# Patient Record
Sex: Male | Born: 1950 | Race: Black or African American | Hispanic: No | Marital: Single | Smoking: Never smoker
Health system: Southern US, Community
[De-identification: ages and names within clinical notes are randomized; demographics above are authoritative.]

## PROBLEM LIST (undated history)

## (undated) DIAGNOSIS — G8929 Other chronic pain: Secondary | ICD-10-CM

## (undated) DIAGNOSIS — M549 Dorsalgia, unspecified: Secondary | ICD-10-CM

## (undated) HISTORY — PX: HIP SURGERY: SHX245

## (undated) HISTORY — DX: Other chronic pain: G89.29

## (undated) HISTORY — PX: THORACOTOMY: SUR1349

## (undated) HISTORY — DX: Dorsalgia, unspecified: M54.9

---

## 2000-02-11 ENCOUNTER — Emergency Department (HOSPITAL_COMMUNITY): Admission: EM | Admit: 2000-02-11 | Discharge: 2000-02-11 | Payer: Self-pay | Admitting: Emergency Medicine

## 2000-02-11 ENCOUNTER — Encounter: Payer: Self-pay | Admitting: Emergency Medicine

## 2009-09-02 ENCOUNTER — Emergency Department (HOSPITAL_COMMUNITY): Admission: EM | Admit: 2009-09-02 | Discharge: 2009-09-02 | Payer: Self-pay | Admitting: Emergency Medicine

## 2009-12-10 ENCOUNTER — Emergency Department (HOSPITAL_COMMUNITY): Admission: EM | Admit: 2009-12-10 | Discharge: 2009-12-10 | Payer: Self-pay | Admitting: Emergency Medicine

## 2011-02-03 LAB — BASIC METABOLIC PANEL
BUN: 10 mg/dL (ref 6–23)
CO2: 28 mEq/L (ref 19–32)
Calcium: 8.9 mg/dL (ref 8.4–10.5)
Chloride: 103 mEq/L (ref 96–112)
Creatinine, Ser: 1.15 mg/dL (ref 0.4–1.5)
GFR calc Af Amer: >60 mL/min (ref >60)
GFR calc non Af Amer: >60 mL/min (ref >60)
Glucose, Bld: 156 mg/dL — ABNORMAL HIGH (ref 70–99)
Potassium: 4 mEq/L (ref 3.5–5.1)
Sodium: 140 mEq/L (ref 135–145)

## 2011-02-03 LAB — URINALYSIS, ROUTINE W REFLEX MICROSCOPIC
Bilirubin Urine: NEGATIVE
Glucose, UA: NEGATIVE mg/dL
Nitrite: NEGATIVE
Specific Gravity, Urine: 1.024 (ref 1.005–1.030)
pH: 8 (ref 5.0–8.0)

## 2011-02-03 LAB — CBC
HCT: 40.6 % (ref 39.0–52.0)
Hemoglobin: 14 g/dL (ref 13.0–17.0)
MCHC: 34.5 g/dL (ref 30.0–36.0)
MCV: 95.9 fL (ref 78.0–100.0)
Platelets: 183 10*3/uL (ref 150–400)
RBC: 4.24 MIL/uL (ref 4.22–5.81)
RDW: 13.3 % (ref 11.5–15.5)
WBC: 11.3 10*3/uL — ABNORMAL HIGH (ref 4.0–10.5)

## 2011-02-03 LAB — HEPATIC FUNCTION PANEL
ALT: 20 U/L (ref 0–53)
Albumin: 3.8 g/dL (ref 3.5–5.2)
Indirect Bilirubin: 0.5 mg/dL (ref 0.3–0.9)
Total Protein: 7.1 g/dL (ref 6.0–8.3)

## 2011-02-03 LAB — LIPASE, BLOOD: Lipase: 41 U/L (ref 11–59)

## 2011-02-03 LAB — DIFFERENTIAL
Eosinophils Absolute: 0 10*3/uL (ref 0.0–0.7)
Lymphs Abs: 1.2 10*3/uL (ref 0.7–4.0)
Monocytes Relative: 2 % — ABNORMAL LOW (ref 3–12)
Neutro Abs: 9.9 10*3/uL — ABNORMAL HIGH (ref 1.7–7.7)
Neutrophils Relative %: 87 % — ABNORMAL HIGH (ref 43–77)

## 2018-12-31 DIAGNOSIS — G8929 Other chronic pain: Secondary | ICD-10-CM

## 2018-12-31 HISTORY — DX: Other chronic pain: G89.29

## 2019-01-09 ENCOUNTER — Encounter: Payer: Self-pay | Admitting: Medical

## 2019-01-09 ENCOUNTER — Ambulatory Visit (INDEPENDENT_AMBULATORY_CARE_PROVIDER_SITE_OTHER): Payer: 59 | Admitting: Medical

## 2019-01-09 VITALS — BP 140/90 | HR 56 | Temp 98.4°F | Resp 16 | Ht 68.5 in | Wt 191.2 lb

## 2019-01-09 DIAGNOSIS — Z1211 Encounter for screening for malignant neoplasm of colon: Secondary | ICD-10-CM

## 2019-01-09 DIAGNOSIS — Z125 Encounter for screening for malignant neoplasm of prostate: Secondary | ICD-10-CM

## 2019-01-09 DIAGNOSIS — Z0000 Encounter for general adult medical examination without abnormal findings: Secondary | ICD-10-CM

## 2019-01-09 DIAGNOSIS — Z131 Encounter for screening for diabetes mellitus: Secondary | ICD-10-CM | POA: Diagnosis not present

## 2019-01-09 DIAGNOSIS — Z7185 Encounter for immunization safety counseling: Secondary | ICD-10-CM | POA: Insufficient documentation

## 2019-01-09 DIAGNOSIS — K4090 Unilateral inguinal hernia, without obstruction or gangrene, not specified as recurrent: Secondary | ICD-10-CM | POA: Diagnosis not present

## 2019-01-09 DIAGNOSIS — R030 Elevated blood-pressure reading, without diagnosis of hypertension: Secondary | ICD-10-CM

## 2019-01-09 DIAGNOSIS — Z1159 Encounter for screening for other viral diseases: Secondary | ICD-10-CM

## 2019-01-09 DIAGNOSIS — Z7189 Other specified counseling: Secondary | ICD-10-CM

## 2019-01-09 DIAGNOSIS — Z113 Encounter for screening for infections with a predominantly sexual mode of transmission: Secondary | ICD-10-CM

## 2019-01-09 LAB — POCT URINALYSIS DIP (PROADVANTAGE DEVICE)
BILIRUBIN UA: NEGATIVE mg/dL
Bilirubin, UA: NEGATIVE
Blood, UA: NEGATIVE
Glucose, UA: NEGATIVE mg/dL
LEUKOCYTES UA: NEGATIVE
NITRITE UA: NEGATIVE
PH UA: 6 (ref 5.0–8.0)
PROTEIN UA: NEGATIVE mg/dL
Specific Gravity, Urine: 1.015
UUROB: NEGATIVE

## 2019-01-09 NOTE — Patient Instructions (Signed)
Thanks for trusting [place] with your health care and for coming in for a physical today.  Below are some general recommendations I have for you:  Yearly screenings See your eye doctor yearly for routine vision care. See your dentist yearly for routine dental care including hygiene visits twice yearly. See me here yearly for a routine physical and preventative care visit   Specific Concerns today:  . Since she has not seen a doctor in many years you are due for several recommended screening test and preventative care measures . I recommend an updated tetanus vaccine, yearly flu shot, pneumococcal vaccine and shingles vaccine . I recommend a colon cancer screen . Your blood pressure was elevated today.  This will need to be monitored over the next month or 2 to determine if you have high blood pressure.  High blood pressure can lead to heart disease, stroke, heart attack, kidney damage, eye damage . We will call with lab results and next steps   Please follow up yearly for a physical.   Preventative Care for Adults - Male      MAINTAIN REGULAR HEALTH EXAMS:  A routine yearly physical is a good way to check in with your primary care provider about your health and preventive screening. It is also an opportunity to share updates about your health and any concerns you have, and receive a thorough all-over exam.   Most health insurance companies pay for at least some preventative services.  Check with your health plan for specific coverages.  WHAT PREVENTATIVE SERVICES DO MEN NEED?  Adult men should have their weight and blood pressure checked regularly.   Men age 79 and older should have their cholesterol levels checked regularly.  Beginning at age 39 and continuing to age 66, men should be screened for colorectal cancer.  Certain people may need continued testing until age 67.  Updating vaccinations is part of preventative care.  Vaccinations help protect against diseases such as the  flu.  Osteoporosis is a disease in which the bones lose minerals and strength as we age. Men ages 83 and over should discuss this with their caregivers  Lab tests are generally done as part of preventative care to screen for anemia and blood disorders, to screen for problems with the kidneys and liver, to screen for bladder problems, to check blood sugar, and to check your cholesterol level.  Preventative services generally include counseling about diet, exercise, avoiding tobacco, drugs, excessive alcohol consumption, and sexually transmitted infections.    GENERAL RECOMMENDATIONS FOR GOOD HEALTH:  Healthy diet:  Eat a variety of foods, including fruit, vegetables, animal or vegetable protein, such as meat, fish, chicken, and eggs, or beans, lentils, tofu, and grains, such as rice.  Drink plenty of water daily.  Decrease saturated fat in the diet, avoid lots of red meat, processed foods, sweets, fast foods, and fried foods.  Exercise:  Aerobic exercise helps maintain good heart health. At least 30-40 minutes of moderate-intensity exercise is recommended. For example, a brisk walk that increases your heart rate and breathing. This should be done on most days of the week.   Find a type of exercise or a variety of exercises that you enjoy so that it becomes a part of your daily life.  Examples are running, walking, swimming, water aerobics, and biking.  For motivation and support, explore group exercise such as aerobic class, spin class, Zumba, Yoga,or  martial arts, etc.    Set exercise goals for yourself, such as  a certain weight goal, walk or run in a race such as a 5k walk/run.  Speak to your primary care provider about exercise goals.  Disease prevention:  If you smoke or chew tobacco, find out from your caregiver how to quit. It can literally save your life, no matter how long you have been a tobacco user. If you do not use tobacco, never begin.   Maintain a healthy diet and normal  weight. Increased weight leads to problems with blood pressure and diabetes.   The Body Mass Index or BMI is a way of measuring how much of your body is fat. Having a BMI above 27 increases the risk of heart disease, diabetes, hypertension, stroke and other problems related to obesity. Your caregiver can help determine your BMI and based on it develop an exercise and dietary program to help you achieve or maintain this important measurement at a healthful level.  High blood pressure causes heart and blood vessel problems.  Persistent high blood pressure should be treated with medicine if weight loss and exercise do not work.   Fat and cholesterol leaves deposits in your arteries that can block them. This causes heart disease and vessel disease elsewhere in your body.  If your cholesterol is found to be high, or if you have heart disease or certain other medical conditions, then you may need to have your cholesterol monitored frequently and be treated with medication.   Ask if you should have a cardiac stress test if your history suggests this. A stress test is a test done on a treadmill that looks for heart disease. This test can find disease prior to there being a problem.  Osteoporosis is a disease in which the bones lose minerals and strength as we age. This can result in serious bone fractures. Risk of osteoporosis can be identified using a bone density scan. Men ages 72 and over should discuss this with their caregivers. Ask your caregiver whether you should be taking a calcium supplement and Vitamin D, to reduce the rate of osteoporosis.   Avoid drinking alcohol in excess (more than two drinks per day).  Avoid use of street drugs. Do not share needles with anyone. Ask for professional help if you need assistance or instructions on stopping the use of alcohol, cigarettes, and/or drugs.  Brush your teeth twice a day with fluoride toothpaste, and floss once a day. Good oral hygiene prevents tooth  decay and gum disease. The problems can be painful, unattractive, and can cause other health problems. Visit your dentist for a routine oral and dental check up and preventive care every 6-12 months.   Look at your skin regularly.  Use a mirror to look at your back. Notify your caregivers of changes in moles, especially if there are changes in shapes, colors, a size larger than a pencil eraser, an irregular border, or development of new moles.  Safety:  Use seatbelts 100% of the time, whether driving or as a passenger.  Use safety devices such as hearing protection if you work in environments with loud noise or significant background noise.  Use safety glasses when doing any work that could send debris in to the eyes.  Use a helmet if you ride a bike or motorcycle.  Use appropriate safety gear for contact sports.  Talk to your caregiver about gun safety.  Use sunscreen with a SPF (or skin protection factor) of 15 or greater.  Lighter skinned people are at a greater risk of skin cancer.  Don't forget to also wear sunglasses in order to protect your eyes from too much damaging sunlight. Damaging sunlight can accelerate cataract formation.   Practice safe sex. Use condoms. Condoms are used for birth control and to help reduce the spread of sexually transmitted infections (or STIs).  Some of the STIs are gonorrhea (the clap), chlamydia, syphilis, trichomonas, herpes, HPV (human papilloma virus) and HIV (human immunodeficiency virus) which causes AIDS. The herpes, HIV and HPV are viral illnesses that have no cure. These can result in disability, cancer and death.   Keep carbon monoxide and smoke detectors in your home functioning at all times. Change the batteries every 6 months or use a model that plugs into the wall.   Vaccinations:  Stay up to date with your tetanus shots and other required immunizations. You should have a booster for tetanus every 10 years. Be sure to get your flu shot every year,  since 5%-20% of the [place] population comes down with the flu. The flu vaccine changes each year, so being vaccinated once is not enough. Get your shot in the fall, before the flu season peaks.   Other vaccines to consider:  Human Papilloma Virus or HPV causes cancer of the cervix, and other infections that can be transmitted from person to person. There is a vaccine for HPV, and males should get immunized between the ages of 92 and 80. It requires a series of 3 shots.   Pneumococcal vaccine to protect against certain types of pneumonia.  This is normally recommended for adults age 30 or older.  However, adults younger than 68 years old with certain underlying conditions such as diabetes, heart or lung disease should also receive the vaccine.  Shingles vaccine to protect against Varicella Zoster if you are older than age 35, or younger than 68 years old with certain underlying illness.  If you have not had the Shingrix vaccine, please call your insurer to inquire about coverage for the Shingrix vaccine given in 2 doses.   Some insurers cover this vaccine after age 30, some cover this after age 31.  If your insurer covers this, then call to schedule appointment to have this vaccine here  Hepatitis A vaccine to protect against a form of infection of the liver by a virus acquired from food.  Hepatitis B vaccine to protect against a form of infection of the liver by a virus acquired from blood or body fluids, particularly if you work in health care.  If you plan to travel internationally, check with your local health department for specific vaccination recommendations.   What should I know about cancer screening? Many types of cancers can be detected early and may often be prevented. Lung Cancer  You should be screened every year for lung cancer if: ? You are a current smoker who has smoked for at least 30 years. ? You are a former smoker who has quit within the past 15 years.  Talk to your health  care provider about your screening options, when you should start screening, and how often you should be screened.  Colorectal Cancer  Routine colorectal cancer screening usually begins at 68 years of age and should be repeated every 5-10 years until you are 68 years old. You may need to be screened more often if early forms of precancerous polyps or small growths are found. Your health care provider may recommend screening at an earlier age if you have risk factors for colon cancer.  Your health care provider  may recommend using home test kits to check for hidden blood in the stool.  A small camera at the end of a tube can be used to examine your colon (sigmoidoscopy or colonoscopy). This checks for the earliest forms of colorectal cancer.  Prostate and Testicular Cancer  Depending on your age and overall health, your health care provider may do certain tests to screen for prostate and testicular cancer.  Talk to your health care provider about any symptoms or concerns you have about testicular or prostate cancer.  Skin Cancer  Check your skin from head to toe regularly.  Tell your health care provider about any new moles or changes in moles, especially if: ? There is a change in a mole's size, shape, or color. ? You have a mole that is larger than a pencil eraser.  Always use sunscreen. Apply sunscreen liberally and repeat throughout the day.  Protect yourself by wearing long sleeves, pants, a wide-brimmed hat, and sunglasses when outside.
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
## 2019-01-10 ENCOUNTER — Other Ambulatory Visit: Payer: Self-pay

## 2019-01-10 DIAGNOSIS — Z1211 Encounter for screening for malignant neoplasm of colon: Secondary | ICD-10-CM

## 2019-01-10 DIAGNOSIS — K4090 Unilateral inguinal hernia, without obstruction or gangrene, not specified as recurrent: Secondary | ICD-10-CM

## 2019-01-10 LAB — LIPID PANEL
CHOL/HDL RATIO: 2.2 ratio (ref 0.0–5.0)
Cholesterol, Total: 156 mg/dL (ref 100–199)
HDL: 70 mg/dL (ref >39)
LDL CALC: 73 mg/dL (ref 0–99)
TRIGLYCERIDES: 66 mg/dL (ref 0–149)
VLDL Cholesterol Cal: 13 mg/dL (ref 5–40)

## 2019-01-10 LAB — CBC WITH DIFFERENTIAL/PLATELET
BASOS: 1 %
Basophils Absolute: 0.1 10*3/uL (ref 0.0–0.2)
EOS (ABSOLUTE): 0.2 10*3/uL (ref 0.0–0.4)
EOS: 3 %
HEMATOCRIT: 40.7 % (ref 37.5–51.0)
HEMOGLOBIN: 13.4 g/dL (ref 13.0–17.7)
Immature Grans (Abs): 0 10*3/uL (ref 0.0–0.1)
Immature Granulocytes: 0 %
LYMPHS ABS: 2.1 10*3/uL (ref 0.7–3.1)
Lymphs: 37 %
MCH: 32.8 pg (ref 26.6–33.0)
MCHC: 32.9 g/dL (ref 31.5–35.7)
MCV: 100 fL — AB (ref 79–97)
MONOCYTES: 8 %
Monocytes Absolute: 0.5 10*3/uL (ref 0.1–0.9)
NEUTROS ABS: 2.8 10*3/uL (ref 1.4–7.0)
Neutrophils: 51 %
Platelets: 226 10*3/uL (ref 150–450)
RBC: 4.09 x10E6/uL — AB (ref 4.14–5.80)
RDW: 11.7 % (ref 11.6–15.4)
WBC: 5.5 10*3/uL (ref 3.4–10.8)

## 2019-01-10 LAB — COMPREHENSIVE METABOLIC PANEL
ALBUMIN: 4.1 g/dL (ref 3.8–4.8)
ALT: 14 IU/L (ref 0–44)
AST: 19 IU/L (ref 0–40)
Albumin/Globulin Ratio: 1.6 (ref 1.2–2.2)
Alkaline Phosphatase: 53 IU/L (ref 39–117)
BUN / CREAT RATIO: 12 (ref 10–24)
BUN: 12 mg/dL (ref 8–27)
Bilirubin Total: 0.6 mg/dL (ref 0.0–1.2)
CO2: 24 mmol/L (ref 20–29)
CREATININE: 0.98 mg/dL (ref 0.76–1.27)
Calcium: 8.9 mg/dL (ref 8.6–10.2)
Chloride: 106 mmol/L (ref 96–106)
GFR calc Af Amer: 92 mL/min/{1.73_m2} (ref >59)
GFR, EST NON AFRICAN AMERICAN: 79 mL/min/{1.73_m2} (ref >59)
GLUCOSE: 90 mg/dL (ref 65–99)
Globulin, Total: 2.6 g/dL (ref 1.5–4.5)
Potassium: 4.7 mmol/L (ref 3.5–5.2)
Sodium: 144 mmol/L (ref 134–144)
TOTAL PROTEIN: 6.7 g/dL (ref 6.0–8.5)

## 2019-01-10 LAB — GC/CHLAMYDIA PROBE AMP
Chlamydia trachomatis, NAA: NEGATIVE
Neisseria gonorrhoeae by PCR: NEGATIVE

## 2019-01-10 LAB — HEPATITIS C ANTIBODY

## 2019-01-10 LAB — RPR: RPR: NONREACTIVE

## 2019-01-10 LAB — HEMOGLOBIN A1C
ESTIMATED AVERAGE GLUCOSE: 108 mg/dL
Hgb A1c MFr Bld: 5.4 % (ref 4.8–5.6)

## 2019-01-10 LAB — PSA: Prostate Specific Ag, Serum: 1.3 ng/mL (ref 0.0–4.0)

## 2019-01-10 LAB — HIV ANTIBODY (ROUTINE TESTING W REFLEX): HIV Screen 4th Generation wRfx: NONREACTIVE

## 2019-01-25 ENCOUNTER — Other Ambulatory Visit: Payer: Self-pay

## 2019-01-25 DIAGNOSIS — Z1211 Encounter for screening for malignant neoplasm of colon: Secondary | ICD-10-CM

## 2019-02-22 LAB — HM COLONOSCOPY

## 2019-02-27 ENCOUNTER — Encounter: Payer: Self-pay | Admitting: Medical

## 2019-03-14 DIAGNOSIS — Z1211 Encounter for screening for malignant neoplasm of colon: Secondary | ICD-10-CM | POA: Diagnosis not present

## 2019-03-21 LAB — COLOGUARD: Cologuard: POSITIVE — AB

## 2019-03-22 ENCOUNTER — Telehealth: Payer: Self-pay | Admitting: Medical

## 2019-03-22 NOTE — Telephone Encounter (Signed)
Unfortunately Cologuard test result was positive.  This can indicate the presence of colorectal cancer or bleeding.   Thus, I recommend referral to gastroenterology at this time.   Please refer to gastroenterology for further evaluations.   (see if they have seen GI prior or have a preference for referral).    

## 2019-03-23 ENCOUNTER — Other Ambulatory Visit: Payer: Self-pay

## 2019-03-23 DIAGNOSIS — R195 Other fecal abnormalities: Secondary | ICD-10-CM

## 2019-03-23 NOTE — Progress Notes (Signed)
gas

## 2019-03-23 NOTE — Telephone Encounter (Signed)
Referral put into epic and proficient. 

## 2019-03-23 NOTE — Telephone Encounter (Signed)
Left message on voicemail for patient to call back. 

## 2019-03-23 NOTE — Telephone Encounter (Signed)
Patient notified of results and is agreeable to referral to GI.

## 2019-04-03 ENCOUNTER — Telehealth: Payer: Self-pay

## 2019-04-03 NOTE — Telephone Encounter (Signed)
Patient was scheduled to see [facility] on 03-22-19 for hernia and no showed the appointment.   Please advise on what you want me to do?

## 2019-04-04 NOTE — Telephone Encounter (Signed)
Call and find out why he didn't go?  And remind him how hard it is to get a patient back in when they no show.

## 2019-04-04 NOTE — Telephone Encounter (Signed)
Patient states that he did go to the appointment and they said that he was not on the schedule.   I told him to call and reschedule appointment asap.  Patient states that he understands and will call them today.

## 2019-04-11 ENCOUNTER — Telehealth: Payer: Self-pay | Admitting: Medical

## 2019-04-11 NOTE — Telephone Encounter (Signed)
FYI Pt called stating that we told him to make appt here. Pt confused about what he needs, after reviewing chart notes he is acutally supposed to call and reschedule missed appt [facility] pt this info and their phone# and he will call them to schedule

## 2020-04-15 ENCOUNTER — Other Ambulatory Visit: Payer: Self-pay

## 2020-04-15 ENCOUNTER — Encounter (HOSPITAL_COMMUNITY): Payer: Self-pay | Admitting: *Deleted

## 2020-04-15 ENCOUNTER — Ambulatory Visit (INDEPENDENT_AMBULATORY_CARE_PROVIDER_SITE_OTHER): Payer: No Typology Code available for payment source | Admitting: Medical

## 2020-04-15 ENCOUNTER — Encounter: Payer: Self-pay | Admitting: Medical

## 2020-04-15 ENCOUNTER — Emergency Department (HOSPITAL_COMMUNITY)
Admission: EM | Admit: 2020-04-15 | Discharge: 2020-04-15 | Disposition: A | Discharge status: Home / Self Care | Payer: No Typology Code available for payment source | Attending: Emergency Medicine | Admitting: Emergency Medicine

## 2020-04-15 VITALS — BP 182/90 | HR 76 | Ht 68.0 in | Wt 185.2 lb

## 2020-04-15 DIAGNOSIS — I10 Essential (primary) hypertension: Secondary | ICD-10-CM | POA: Diagnosis not present

## 2020-04-15 DIAGNOSIS — Y929 Unspecified place or not applicable: Secondary | ICD-10-CM | POA: Insufficient documentation

## 2020-04-15 DIAGNOSIS — W19XXXA Unspecified fall, initial encounter: Secondary | ICD-10-CM

## 2020-04-15 DIAGNOSIS — S0592XA Unspecified injury of left eye and orbit, initial encounter: Secondary | ICD-10-CM | POA: Diagnosis present

## 2020-04-15 DIAGNOSIS — F1010 Alcohol abuse, uncomplicated: Secondary | ICD-10-CM

## 2020-04-15 DIAGNOSIS — Y939 Activity, unspecified: Secondary | ICD-10-CM | POA: Insufficient documentation

## 2020-04-15 DIAGNOSIS — W1839XA Other fall on same level, initial encounter: Secondary | ICD-10-CM | POA: Diagnosis not present

## 2020-04-15 DIAGNOSIS — S0180XA Unspecified open wound of other part of head, initial encounter: Secondary | ICD-10-CM

## 2020-04-15 DIAGNOSIS — Y999 Unspecified external cause status: Secondary | ICD-10-CM | POA: Insufficient documentation

## 2020-04-15 DIAGNOSIS — S0181XA Laceration without foreign body of other part of head, initial encounter: Secondary | ICD-10-CM

## 2020-04-15 DIAGNOSIS — S0031XA Abrasion of nose, initial encounter: Secondary | ICD-10-CM

## 2020-04-15 DIAGNOSIS — F17200 Nicotine dependence, unspecified, uncomplicated: Secondary | ICD-10-CM | POA: Diagnosis not present

## 2020-04-15 DIAGNOSIS — Z23 Encounter for immunization: Secondary | ICD-10-CM | POA: Insufficient documentation

## 2020-04-15 DIAGNOSIS — S01112A Laceration without foreign body of left eyelid and periocular area, initial encounter: Secondary | ICD-10-CM

## 2020-04-15 MED ORDER — ERYTHROMYCIN 5 MG/GM OP OINT
TOPICAL_OINTMENT | Freq: Once | OPHTHALMIC | Status: AC
  Administered 2020-04-15: 1 via OPHTHALMIC
  Filled 2020-04-15: qty 3.5

## 2020-04-15 MED ORDER — TETANUS-DIPHTHERIA TOXOIDS TD 5-2 LFU IM INJ
0.5000 mL | INJECTION | Freq: Once | INTRAMUSCULAR | Status: AC
  Administered 2020-04-15: 0.5 mL via INTRAMUSCULAR
  Filled 2020-04-15: qty 0.5

## 2020-04-15 NOTE — ED Notes (Signed)
Pt discharged at this time.

## 2020-04-15 NOTE — Progress Notes (Addendum)
Subjective: Chief Complaint  Patient presents with  . Fall  . Eye Injury    left eye    Here today for fall and injury to left eye.  The injury occurred on Sunday, June 13 in the late evening.  he ended up tripping on something and falling face down on his brick patio.  He does not recall exactly what he hit other than the brick.  He ended up getting up and going back into his house on his own.  He notes that he was drinking alcohol that evening.  He did not realize until the next day that he had injured his face.  He knew he had some bruising of the left and right face but did not really realize that he had torn off part of the skin of his eyelid until yesterday morning.  His eye has been watery but he feels like his vision is okay.  He has discomfort of the eyelid and skin tear but the left eye in general does not necessarily hurt.  No photophobia.  Right eye is fine.  He is bruising over the right side of his face.  He denies any other facial pain.  No unusual movements of his face.  He denies neck pain.  He denies any other injury.  He has been to this clinic once before in 2020 for a physical but has not been back since then.  He denies chest pain, does not check his blood pressures rarely although they are elevated today.   Past Medical History:  Diagnosis Date  . Chronic back pain 12/2018   self reported   Current Outpatient Medications on File Prior to Visit  Medication Sig Dispense Refill  . ibuprofen (ADVIL,MOTRIN) 200 MG tablet Take 200 mg by mouth every 6 (six) hours as needed. (Patient not taking: Reported on 04/15/2020)     No current facility-administered medications on file prior to visit.   ROS as in subjective   Objective: BP (!) 182/90   Pulse 76   Ht 5\' 8"  (1.727 m)   Wt 185 lb 3.2 oz (84 kg)   SpO2 98%   BMI 28.16 kg/m   Gen: wd, wn, nad, African-American male Psych: Pleasant, answers questions appropriate, alert and oriented x3 Eyes: There appears to be  avulsion of the left lower eyelid midline to lateral, involving over 50% of the lower eyelid, there is watery discharge of the left eye there is erythema along the lower lateral portion of the eye.  Pupils are reactive bilaterally, extraocular motions intact.  There is some general tenderness along the mid to lateral orbit bilaterally.  There is a swollen area of bruising of the right lateral orbit as well. There is abrasion of the left nare Rest the face nontender, normal facial motions, no step-off deformity Neck supple, soft, nontender, no midline tenderness posteriorly, no obvious mass, seemingly normal range of motion No other obvious injury of the extremities or torso    Assessment: Encounter Diagnoses  Name Primary?  Marland Kitchen Avulsion of skin of face, initial encounter Yes  . Left eyelid laceration, initial encounter   . Fall, initial encounter   . Alcohol abuse   . Essential hypertension, benign   . Abrasion of nose, initial encounter      Plan: Fall, left eyelid convulsion -date of injury was a little over 24 hours ago.   Visual acuity left eye 20/25 today uncorrected.  Right eye also 20/25 today uncorrected.  I called and spoke to  Dr. Fawn Kirk ophthalmology about his case.  Given the avulsion of the eyelid and injury to the eye with potential for decreased lubrication and potential vision loss, he advise he should be seen in the emergency department, have facial plastic surgery evaluation.  He was also advised that he needs to lubricate the eye with wetting drops every 15 to 20 minutes.    I advised he go straight to the emergency department now for evaluation and lubrication of the eye until evaluation occurs.  Of note I have only seen him once before in 2020 for a physical.  At that time he had elevated blood pressures but has not been back since  Hypertension-this will need to be addressed as well.  I called and spoke to [facility] Dept triage nurse Marlene Bast about his case and  that he would be presenting soon  Stevenson was seen today for fall and eye injury.  Diagnoses and all orders for this visit:  Avulsion of skin of face, initial encounter  Left eyelid laceration, initial encounter  Fall, initial encounter  Alcohol abuse  Essential hypertension, benign  Abrasion of nose, initial encounter   Advised he report to emergency dept now.

## 2020-04-15 NOTE — Discharge Instructions (Signed)
Go directly to the ophthalmologist office when you leave here

## 2020-04-15 NOTE — ED Notes (Addendum)
Pt provided with directions to opthalmology office. Pt verbalizes understanding and follow up.
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
## 2020-04-15 NOTE — ED Triage Notes (Signed)
Pt fell 2 nights ago and tore his left lower eye lid. He denies pain or change in vision.

## 2021-04-07 ENCOUNTER — Telehealth: Payer: Self-pay | Admitting: Medical

## 2021-04-07 NOTE — Telephone Encounter (Signed)
Dismissal letter in guarantor snapshot  °

## 2021-06-08 ENCOUNTER — Emergency Department (HOSPITAL_BASED_OUTPATIENT_CLINIC_OR_DEPARTMENT_OTHER): Payer: No Typology Code available for payment source

## 2021-06-08 ENCOUNTER — Other Ambulatory Visit (HOSPITAL_BASED_OUTPATIENT_CLINIC_OR_DEPARTMENT_OTHER): Payer: Self-pay

## 2021-06-08 ENCOUNTER — Encounter (HOSPITAL_BASED_OUTPATIENT_CLINIC_OR_DEPARTMENT_OTHER): Payer: Self-pay | Admitting: Emergency Medicine

## 2021-06-08 ENCOUNTER — Other Ambulatory Visit: Payer: Self-pay

## 2021-06-08 ENCOUNTER — Emergency Department (HOSPITAL_BASED_OUTPATIENT_CLINIC_OR_DEPARTMENT_OTHER)
Admission: EM | Admit: 2021-06-08 | Discharge: 2021-06-08 | Disposition: A | Discharge status: Home / Self Care | Payer: No Typology Code available for payment source | Attending: Emergency Medicine | Admitting: Emergency Medicine

## 2021-06-08 DIAGNOSIS — U071 COVID-19: Secondary | ICD-10-CM | POA: Insufficient documentation

## 2021-06-08 DIAGNOSIS — F17200 Nicotine dependence, unspecified, uncomplicated: Secondary | ICD-10-CM | POA: Insufficient documentation

## 2021-06-08 DIAGNOSIS — R059 Cough, unspecified: Secondary | ICD-10-CM | POA: Diagnosis present

## 2021-06-08 DIAGNOSIS — I10 Essential (primary) hypertension: Secondary | ICD-10-CM | POA: Insufficient documentation

## 2021-06-08 MED ORDER — SODIUM CHLORIDE 0.9 % IV BOLUS: 500.0000 mL | Freq: Once | INTRAVENOUS | Status: DC

## 2021-06-08 MED ORDER — NIRMATRELVIR/RITONAVIR (PAXLOVID)TABLET
3.0000 | ORAL_TABLET | Freq: Two times a day (BID) | ORAL | 0 refills | Status: AC
  Filled 2021-06-08: qty 30, 5d supply, fill #0

## 2021-06-08 MED ORDER — ACETAMINOPHEN 325 MG PO TABS
650.0000 mg | ORAL_TABLET | Freq: Once | ORAL | Status: AC
  Administered 2021-06-08: 650 mg via ORAL
  Filled 2021-06-08: qty 2

## 2021-06-08 MED ORDER — ACETAMINOPHEN 325 MG PO TABS: 650.0000 mg | ORAL_TABLET | Freq: Once | ORAL | Status: DC

## 2021-06-08 MED ORDER — BENZONATATE 100 MG PO CAPS
100.0000 mg | ORAL_CAPSULE | Freq: Three times a day (TID) | ORAL | 0 refills | Status: DC
  Filled 2021-06-08: qty 21, 7d supply, fill #0
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
## 2021-06-08 NOTE — ED Triage Notes (Signed)
States "I don't feel good". C/o cough x 2 weeks. Positive COVID test yesterday.

## 2021-06-08 NOTE — Discharge Instructions (Addendum)
I have written you medicine for COVID treatment as well as cough.  Follow-up with your primary care doctor return to the ED if her symptoms worsen.  Recommend Tylenol 1000 mg every 6 hours as needed for body aches, weakness, fever.

## 2021-07-25 ENCOUNTER — Emergency Department (HOSPITAL_BASED_OUTPATIENT_CLINIC_OR_DEPARTMENT_OTHER): Payer: No Typology Code available for payment source | Admitting: Radiology

## 2021-07-25 ENCOUNTER — Other Ambulatory Visit: Payer: Self-pay

## 2021-07-25 ENCOUNTER — Emergency Department (HOSPITAL_BASED_OUTPATIENT_CLINIC_OR_DEPARTMENT_OTHER): Payer: No Typology Code available for payment source

## 2021-07-25 ENCOUNTER — Encounter (HOSPITAL_BASED_OUTPATIENT_CLINIC_OR_DEPARTMENT_OTHER): Payer: Self-pay | Admitting: Emergency Medicine

## 2021-07-25 ENCOUNTER — Emergency Department (HOSPITAL_BASED_OUTPATIENT_CLINIC_OR_DEPARTMENT_OTHER)
Admission: EM | Admit: 2021-07-25 | Discharge: 2021-07-25 | Disposition: A | Discharge status: Home / Self Care | Payer: No Typology Code available for payment source | Attending: Emergency Medicine | Admitting: Emergency Medicine

## 2021-07-25 DIAGNOSIS — K8020 Calculus of gallbladder without cholecystitis without obstruction: Secondary | ICD-10-CM | POA: Insufficient documentation

## 2021-07-25 DIAGNOSIS — R059 Cough, unspecified: Secondary | ICD-10-CM | POA: Insufficient documentation

## 2021-07-25 DIAGNOSIS — I10 Essential (primary) hypertension: Secondary | ICD-10-CM | POA: Insufficient documentation

## 2021-07-25 DIAGNOSIS — R0602 Shortness of breath: Secondary | ICD-10-CM | POA: Diagnosis not present

## 2021-07-25 LAB — CBC WITH DIFFERENTIAL/PLATELET
Abs Immature Granulocytes: 0.01 10*3/uL (ref 0.00–0.07)
Basophils Absolute: 0 10*3/uL (ref 0.0–0.1)
Basophils Relative: 1 %
Eosinophils Absolute: 0.3 10*3/uL (ref 0.0–0.5)
Eosinophils Relative: 5 %
HCT: 42.8 % (ref 39.0–52.0)
Hemoglobin: 14.2 g/dL (ref 13.0–17.0)
Immature Granulocytes: 0 %
Lymphocytes Relative: 41 %
Lymphs Abs: 2.3 10*3/uL (ref 0.7–4.0)
MCH: 32.4 pg (ref 26.0–34.0)
MCHC: 33.2 g/dL (ref 30.0–36.0)
MCV: 97.7 fL (ref 80.0–100.0)
Monocytes Absolute: 0.5 10*3/uL (ref 0.1–1.0)
Monocytes Relative: 9 %
Neutro Abs: 2.5 10*3/uL (ref 1.7–7.7)
Neutrophils Relative %: 44 %
Platelets: 196 10*3/uL (ref 150–400)
RBC: 4.38 MIL/uL (ref 4.22–5.81)
RDW: 12.8 % (ref 11.5–15.5)
WBC: 5.6 10*3/uL (ref 4.0–10.5)
nRBC: 0 % (ref 0.0–0.2)

## 2021-07-25 LAB — COMPREHENSIVE METABOLIC PANEL
ALT: 10 U/L (ref 0–44)
AST: 17 U/L (ref 15–41)
Albumin: 3.9 g/dL (ref 3.5–5.0)
Alkaline Phosphatase: 46 U/L (ref 38–126)
Anion gap: 10 (ref 5–15)
BUN: 9 mg/dL (ref 8–23)
CO2: 24 mmol/L (ref 22–32)
Calcium: 9.1 mg/dL (ref 8.9–10.3)
Chloride: 106 mmol/L (ref 98–111)
Creatinine, Ser: 0.82 mg/dL (ref 0.61–1.24)
GFR, Estimated: >60 mL/min (ref >60)
Glucose, Bld: 81 mg/dL (ref 70–99)
Potassium: 4.1 mmol/L (ref 3.5–5.1)
Sodium: 140 mmol/L (ref 135–145)
Total Bilirubin: 0.8 mg/dL (ref 0.3–1.2)
Total Protein: 7 g/dL (ref 6.5–8.1)

## 2021-07-25 LAB — TROPONIN I (HIGH SENSITIVITY)
Troponin I (High Sensitivity): 28 ng/L — ABNORMAL HIGH (ref <18)
Troponin I (High Sensitivity): 27 ng/L — ABNORMAL HIGH (ref <18)

## 2021-07-25 LAB — BRAIN NATRIURETIC PEPTIDE: B Natriuretic Peptide: 44.8 pg/mL (ref 0.0–100.0)

## 2021-07-25 MED ORDER — BENZONATATE 100 MG PO CAPS: 100.0000 mg | ORAL_CAPSULE | Freq: Three times a day (TID) | ORAL | 0 refills | Status: AC

## 2021-07-25 MED ORDER — IOHEXOL 350 MG/ML SOLN: 100.0000 mL | Freq: Once | INTRAVENOUS | Status: DC | PRN

## 2021-07-25 MED ORDER — ALBUTEROL SULFATE HFA 108 (90 BASE) MCG/ACT IN AERS
2.0000 | INHALATION_SPRAY | Freq: Once | RESPIRATORY_TRACT | Status: AC
  Administered 2021-07-25: 2 via RESPIRATORY_TRACT
  Filled 2021-07-25: qty 6.7

## 2021-07-25 MED ORDER — BENZONATATE 100 MG PO CAPS
100.0000 mg | ORAL_CAPSULE | Freq: Three times a day (TID) | ORAL | 0 refills | Status: DC
  Filled 2021-07-25: qty 21, 7d supply, fill #0

## 2021-07-25 MED ORDER — IOHEXOL 350 MG/ML SOLN
80.0000 mL | Freq: Once | INTRAVENOUS | Status: AC | PRN
  Administered 2021-07-25: 80 mL via INTRAVENOUS

## 2021-07-25 NOTE — ED Notes (Signed)
Pt dc home . Pt voiced understanding of dc instructions and inhaler use.
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
## 2021-07-25 NOTE — Discharge Instructions (Addendum)
Your workup was overall reassuring in the ED today without any obvious findings  Please follow up with your PCP for further eval. If you do not have a PCP you can follow up with [facility] for primary care needs  Take your printed prescription to any pharmacy of your choosing. Our pharmacy here is closed over the weekend.  Take medication as needed.  Use the inhaler as indicated.   Return to the ED for any new/worsening symptoms

## 2021-07-25 NOTE — ED Triage Notes (Signed)
Pt reports "can't breathe" x 2 weeks. Pt reports cough, history of COVID recently.

## 2021-07-25 NOTE — ED Notes (Signed)
Patient transported to CT 

## 2021-07-27 ENCOUNTER — Other Ambulatory Visit (HOSPITAL_BASED_OUTPATIENT_CLINIC_OR_DEPARTMENT_OTHER): Payer: Self-pay

## 2021-08-18 ENCOUNTER — Other Ambulatory Visit (HOSPITAL_BASED_OUTPATIENT_CLINIC_OR_DEPARTMENT_OTHER): Payer: Self-pay

## 2022-06-29 IMAGING — CT CT ANGIO CHEST
2 of 7 series · 17 of 46 positions shown · IV contrast (omnipaque)
Comparison: None.

CLINICAL DATA: Shortness of breath

EXAM:
CT ANGIOGRAPHY CHEST WITH CONTRAST
TECHNIQUE: Multidetector CT imaging of the chest was performed using the
standard protocol during bolus administration of intravenous
contrast. Multiplanar CT image reconstructions and MIPs were
obtained to evaluate the vascular anatomy.
CONTRAST:  80mL OMNIPAQUE IOHEXOL 350 MG/ML SOLN

[Series 8: pe axial thins · axial · 0.78mm/px · z∈[+1001,+1277]mm · 14 of 320 slices shown]
[im 22/320  lung]
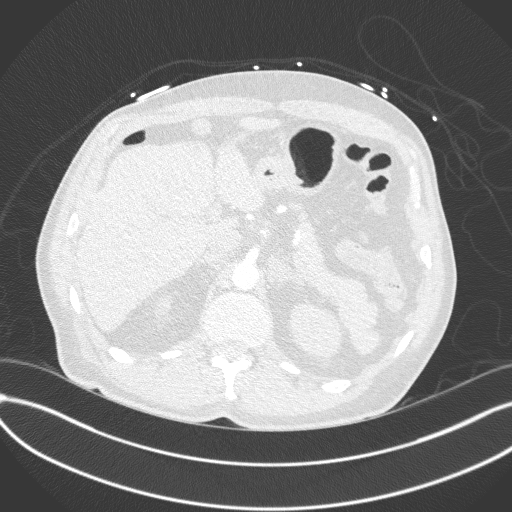
[im 43/320  soft-tissue]
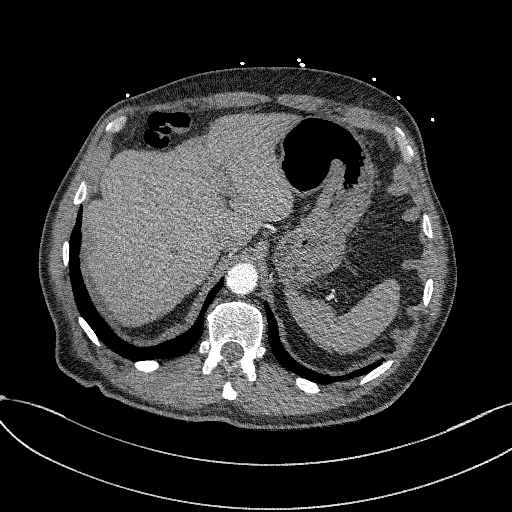
[im 64/320  lung]
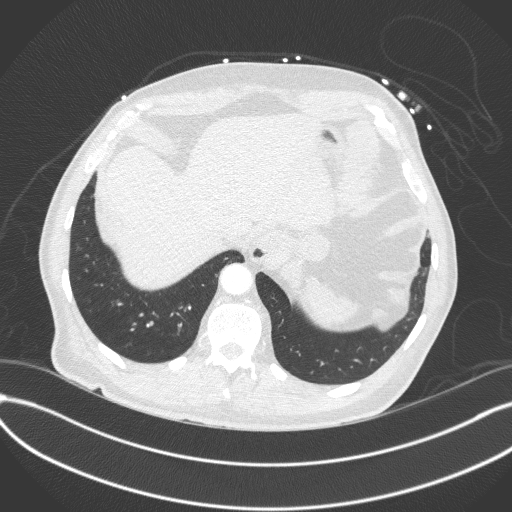
[im 86/320  soft-tissue]
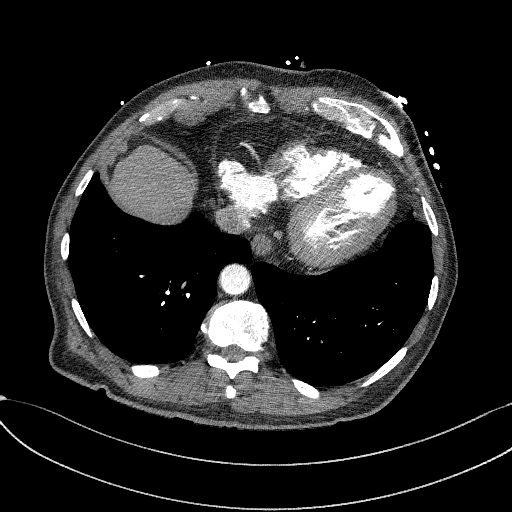
[im 107/320  lung]
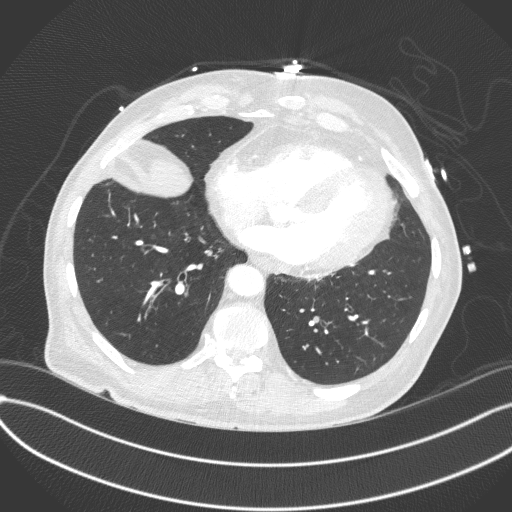
[im 128/320  soft-tissue]
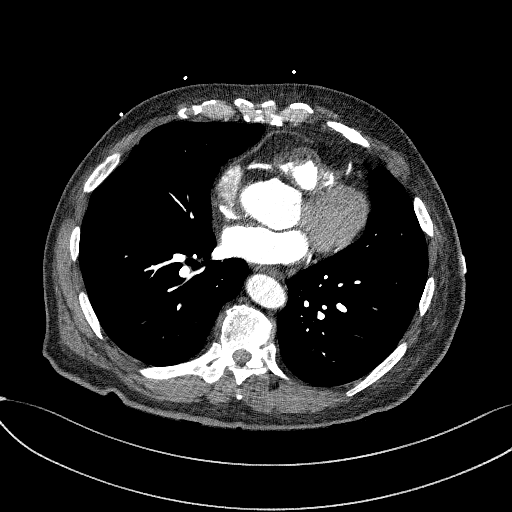
[im 149/320  lung]
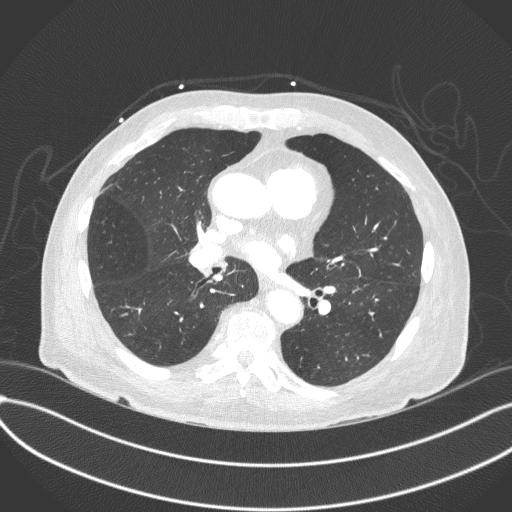
[im 171/320  soft-tissue]
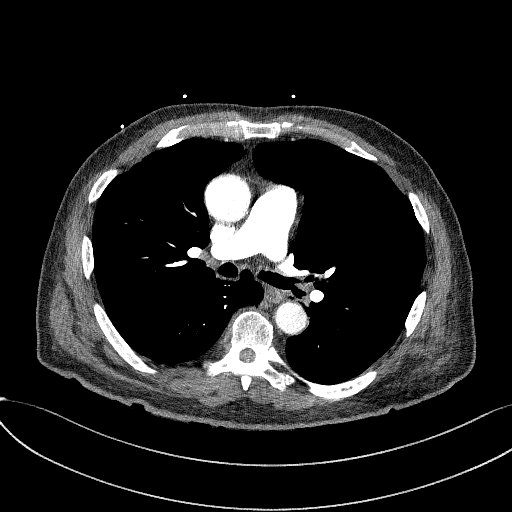
[im 192/320  lung]
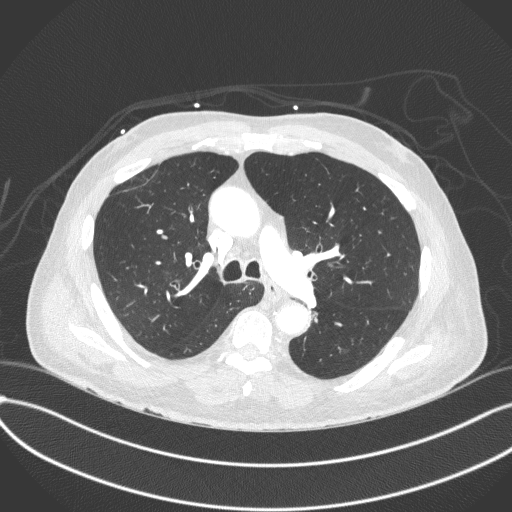
[im 213/320  soft-tissue]
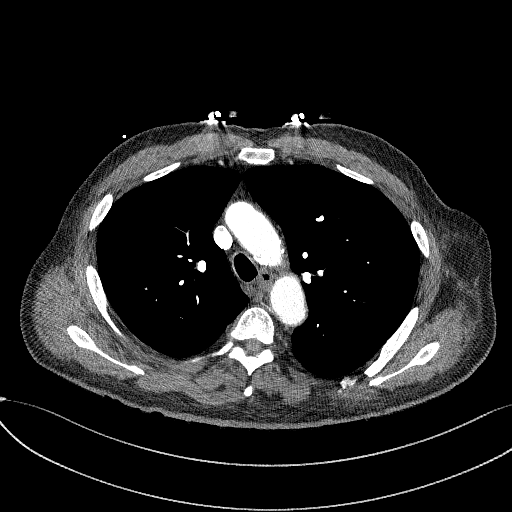
[im 234/320  lung]
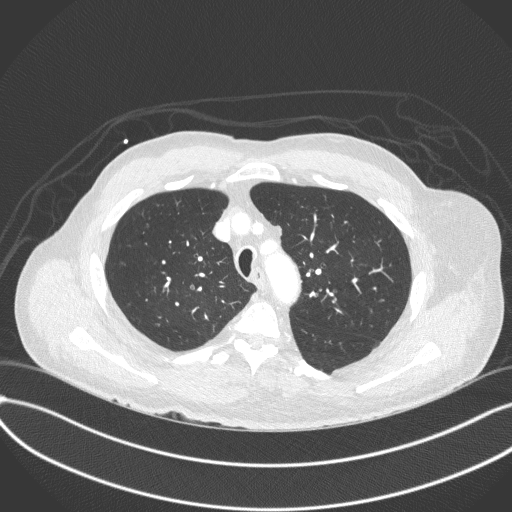
[im 256/320  soft-tissue]
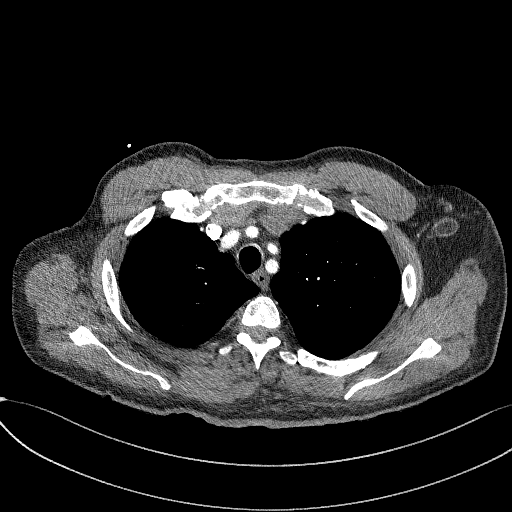
[im 277/320  lung]
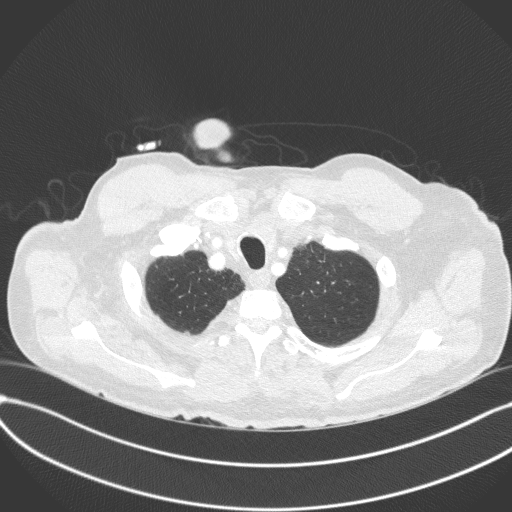
[im 298/320  soft-tissue]
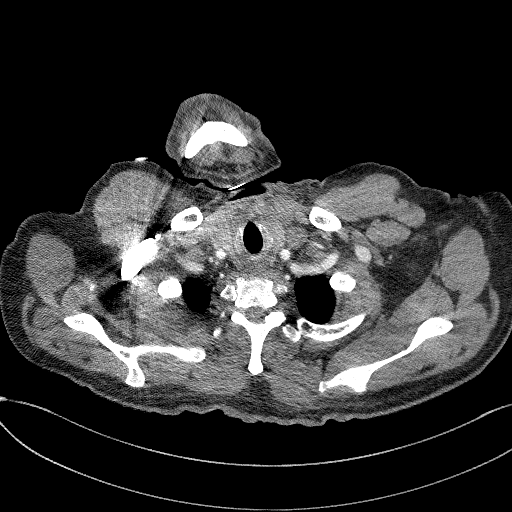

[Series 10: cor soft · coronal · 0.67mm/px · 3 of 146 slices shown]
[im 37/146  soft-tissue]
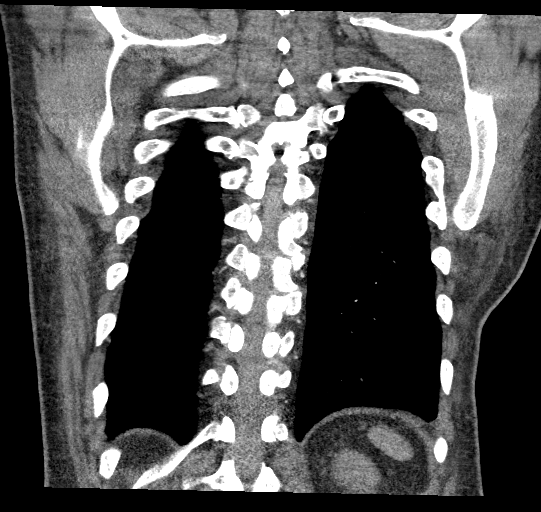
[im 73/146  soft-tissue]
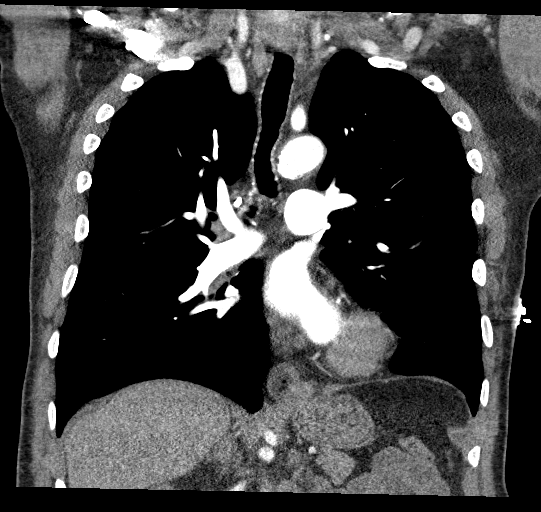
[im 109/146  soft-tissue]
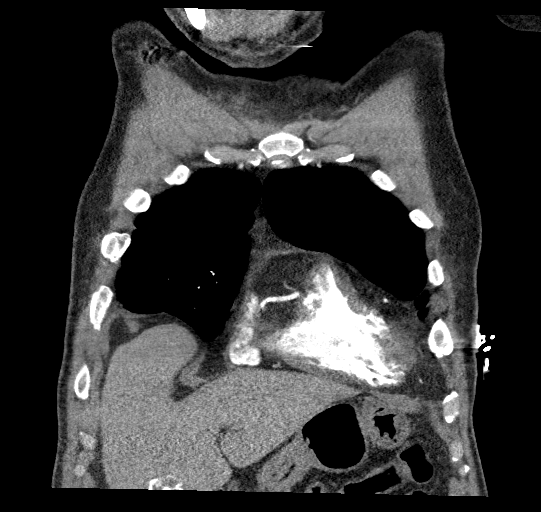

[17 of 46 positions shown; findings below may reference images not displayed]

FINDINGS: Cardiovascular: Satisfactory opacification of the pulmonary arteries
to the segmental level. No evidence of pulmonary embolism. Normal
heart size. No pericardial effusion. Aortic atherosclerosis.

Mediastinum/Nodes: No enlarged mediastinal, hilar, or axillary lymph
nodes. Thyroid gland, trachea, and esophagus demonstrate no
significant findings.

Lungs/Pleura: Lungs are clear. No pleural effusion or pneumothorax.

Upper Abdomen: No acute abnormality. Rim calcified gallstones
partially imaged in the upper abdomen.

Musculoskeletal: Incidental lipoma of the left axilla (series 8,
image 81). No acute or significant osseous findings.

Review of the MIP images confirms the above findings.
IMPRESSION: 1. Negative examination for pulmonary embolism.
2. Cholelithiasis.

Aortic Atherosclerosis ([id number]).
